# Patient Record
Sex: Female | Born: 1980 | Race: Black or African American | Hispanic: No | Marital: Married | Smoking: Never smoker
Health system: Southern US, Community
[De-identification: ages and names within clinical notes are randomized; demographics above are authoritative.]

## PROBLEM LIST (undated history)

## (undated) DIAGNOSIS — L732 Hidradenitis suppurativa: Secondary | ICD-10-CM

---

## 2016-04-19 ENCOUNTER — Emergency Department (HOSPITAL_COMMUNITY)
Admission: EM | Admit: 2016-04-19 | Discharge: 2016-04-20 | Disposition: A | Discharge status: Home / Self Care | Payer: Medicaid - Out of State | Attending: Emergency Medicine | Admitting: Emergency Medicine

## 2016-04-19 ENCOUNTER — Encounter (HOSPITAL_COMMUNITY): Payer: Self-pay | Admitting: Emergency Medicine

## 2016-04-19 DIAGNOSIS — H6001 Abscess of right external ear: Secondary | ICD-10-CM

## 2016-04-19 DIAGNOSIS — H9201 Otalgia, right ear: Secondary | ICD-10-CM | POA: Diagnosis present

## 2016-04-19 HISTORY — DX: Hidradenitis suppurativa: L73.2

## 2016-04-19 NOTE — ED Triage Notes (Signed)
Called x 1 w/o answer.  

## 2016-04-19 NOTE — ED Triage Notes (Signed)
Pt states that she started having R ear lobe swelling two days ago that popped and drained today. The ear is still very tender and the neck below it. Alert and oriented.

## 2016-04-20 MED ORDER — SULFAMETHOXAZOLE-TRIMETHOPRIM 800-160 MG PO TABS: 1.0000 | ORAL_TABLET | Freq: Two times a day (BID) | ORAL | 0 refills | Status: AC

## 2016-04-20 MED ORDER — SULFAMETHOXAZOLE-TRIMETHOPRIM 800-160 MG PO TABS
1.0000 | ORAL_TABLET | Freq: Once | ORAL | Status: AC
  Administered 2016-04-20: 1 via ORAL

## 2016-04-20 MED ORDER — SULFAMETHOXAZOLE-TRIMETHOPRIM 800-160 MG PO TABS
ORAL_TABLET | ORAL | Status: AC
  Filled 2016-04-20: qty 1

## 2016-04-20 NOTE — Discharge Instructions (Signed)
Take antibiotics as prescribed. Continue warm compresses several times a day. Return to the ER for new or worsening symptoms.

## 2016-04-20 NOTE — ED Provider Notes (Signed)
[facility]-EMERGENCY DEPT Provider Note   CSN: [id number] Arrival date & time: 04/19/16  2225  By signing my name below, I, Alyssa GroveMartin Green, attest that this documentation has been prepared under the direction and in the presence of LarnedSerena Zoria Rawlinson, [place]. Electronically Signed: Alyssa GroveMartin Green, ED Scribe. 04/20/16. 12:15 AM.  History   Chief Complaint Chief Complaint  Patient presents with  . Otalgia   The history is provided by the patient. No language interpreter was used.    HPI Comments: Sara Durham is a 35 y.o. female who presents to the Emergency Department complaining of gradual onset, constant right ear lobe pain onset two days ago. She reports her earlobe swelling up and getting tender. She states today her ear lobe lesion popped and drained. She states her neck below her ear is sensitive to the touch and feels a bit swollen. Her ear swelling has decreased, but is still tender. Pt has her ear pierced in the area. She denies having similar symptoms before. Denies fever, chills.   Past Medical History:  Diagnosis Date  . Hydradenitis     There are no active problems to display for this patient.  History reviewed. No pertinent surgical history.  OB History    No data available      Home Medications    Prior to Admission medications   Not on File   Family History No family history on file.  Social History Social History  Substance Use Topics  . Smoking status: Never Smoker  . Smokeless tobacco: Never Used  . Alcohol use No     Allergies   Review of patient's allergies indicates no known allergies.   Review of Systems Review of Systems  Constitutional: Negative for fever.  HENT: Positive for ear discharge and ear pain.   Musculoskeletal: Positive for neck pain.   Physical Exam Updated Vital Signs BP 117/74 (BP Location: Right Arm)   Pulse 61   Temp 98.1 F (36.7 C) (Oral)   Resp 18   LMP 03/28/2016   SpO2 100%   Physical Exam  Constitutional: She appears  well-developed and well-nourished. She is active. No distress.  HENT:  Head: Normocephalic and atraumatic.  Right earlobe with area that has scabbed over. No fluctuance or edema. Trace amount of purulence expressible. No skin erythema.  TM unremarkable bilaterally  Eyes: Conjunctivae are normal.  Neck: Normal range of motion.  +right submandibular lymphadenopathy  Cardiovascular: Normal rate.   Pulmonary/Chest: Effort normal. No respiratory distress.  Neurological: She is alert.  Skin: Skin is warm and dry.  Psychiatric: She has a normal mood and affect. Her behavior is normal.  Nursing note and vitals reviewed.  ED Treatments / Results  DIAGNOSTIC STUDIES: Oxygen Saturation is 100% on RA, normal by my interpretation.    COORDINATION OF CARE: 12:19 AM Discussed treatment plan with pt at bedside which includes Bactrim and pt agreed to plan.  Labs (all labs ordered are listed, but only abnormal results are displayed) Labs Reviewed - No data to display  EKG  EKG Interpretation None       Radiology No results found.  Procedures Procedures (including critical care time)  Medications Ordered in ED Medications - No data to display   Initial Impression / Assessment and Plan / ED Course  I have reviewed the triage vital signs and the nursing notes.  Pertinent labs & imaging results that were available during my care of the patient were reviewed by me and considered in my medical decision making (  see chart for details).  Clinical Course    Pt with what appears to be a right earlobe abscess that has drained on its own. Will cover with course of bactrim to ensure resolution of infection. Encouraged warm compresses. ER return precautions given.  Final Clinical Impressions(s) / ED Diagnoses   Final diagnoses:  Abscess of earlobe, right    New Prescriptions Discharge Medication List as of 04/20/2016 12:22 AM    START taking these medications   Details    sulfamethoxazole-trimethoprim (BACTRIM DS,SEPTRA DS) 800-160 MG tablet Take 1 tablet by mouth 2 (two) times daily., Starting Fri 04/20/2016, Until Fri 04/27/2016, Print       I personally performed the services described in this documentation, which was scribed in my presence. The recorded information has been reviewed and is accurate.    Carlene Coria, PA-C 04/20/16 1926    Arby Barrette, MD 04/24/16 [phone]

## 2016-07-11 ENCOUNTER — Encounter (HOSPITAL_COMMUNITY): Payer: Self-pay | Admitting: Emergency Medicine

## 2016-07-11 ENCOUNTER — Emergency Department (HOSPITAL_COMMUNITY): Payer: Medicaid - Out of State

## 2016-07-11 ENCOUNTER — Emergency Department (HOSPITAL_COMMUNITY)
Admission: EM | Admit: 2016-07-11 | Discharge: 2016-07-11 | Disposition: A | Discharge status: Home / Self Care | Payer: Medicaid - Out of State | Attending: Emergency Medicine | Admitting: Emergency Medicine

## 2016-07-11 DIAGNOSIS — N6320 Unspecified lump in the left breast, unspecified quadrant: Secondary | ICD-10-CM | POA: Diagnosis present

## 2016-07-11 DIAGNOSIS — L0291 Cutaneous abscess, unspecified: Secondary | ICD-10-CM

## 2016-07-11 NOTE — ED Triage Notes (Signed)
Per pt, states she noticed a palpable mass in left breast-no discharge-pain at site

## 2016-07-11 NOTE — ED Notes (Signed)
Ultrasound at bedside

## 2016-07-11 NOTE — Discharge Instructions (Signed)
You were seen in the emergency room today for evaluation of a mass in your left breast. Ultrasound revealed two cystic masses, the larger one is the one that you feel now. The radiologist did recommend aspiration of the larger cyst if we are concerned about possible infection. I am concerned you might be developing an infection and would like you to see Dr[facility] tomorrow for further evaluation and possible drainage. You have an appointment at their office at 3:00 PM. Please arrive at 2:30 PM. Their office address and phone number is listed on this paperwork. Return to the emergency room for new or worsening symptoms.

## 2016-07-11 NOTE — ED Provider Notes (Signed)
[facility]-EMERGENCY DEPT Provider Note   CSN: [id number] Arrival date & time: 07/11/16  1315  By signing my name below, I, Sonum Patel, attest that this documentation has been prepared under the direction and in the presence of Traycen Goyer, PA-C. Electronically Signed: Sonum Patel, Neurosurgeon. 07/11/16. 3:25 PM.   History   Chief Complaint Chief Complaint  Patient presents with  . lump in breast    The history is provided by the patient. No language interpreter was used.     HPI Comments: Sara Durham is a 35 y.o. female with past medical history of hidradenitis suppurativa who presents to the Emergency Department complaining of a painful lump to the left breast that she noticed yesterday. She states she is sure it wasn't there before.  She reports history of abscesses but typically to the groin and buttocks than the axilla or breast. She denies fever, chills, nipple drainage. Denies nausea or vomiting. She is not currently breastfeeding. Denies overlying skin color change. Denies history of breast mass. Denies personal history or family history of breast cancer.   Notably pt recently moved to [place] from [place] and has yet to establish primary care locally.  Past Medical History:  Diagnosis Date  . Hydradenitis     There are no active problems to display for this patient.   History reviewed. No pertinent surgical history.  OB History    No data available       Home Medications    Prior to Admission medications   Not on File    Family History No family history on file.  Social History Social History  Substance Use Topics  . Smoking status: Never Smoker  . Smokeless tobacco: Never Used  . Alcohol use No     Allergies   Patient has no known allergies.   Review of Systems Review of Systems  10 Systems reviewed and are negative for acute change except as noted in the HPI.   Physical Exam Updated Vital Signs BP 114/72   Pulse (!) 59   Temp 98.6 F (37 C)    Resp 16   LMP 07/11/2016   SpO2 98%   Physical Exam  Constitutional: She is oriented to person, place, and time. She appears well-developed and well-nourished.  HENT:  Head: Normocephalic and atraumatic.  Right Ear: External ear normal.  Left Ear: External ear normal.  Eyes: Conjunctivae are normal.  Neck: Normal range of motion. Neck supple.  Cardiovascular: Normal rate, regular rhythm and normal heart sounds.   Pulmonary/Chest: Effort normal and breath sounds normal. No respiratory distress.  Abdominal: Soft. Bowel sounds are normal. She exhibits no distension. There is no tenderness. There is no guarding.  Lymphadenopathy:    She has no cervical adenopathy.    She has no axillary adenopathy.       Right: No supraclavicular adenopathy present.       Left: No supraclavicular adenopathy present.  Neurological: She is alert and oriented to person, place, and time.  Skin: Skin is warm and dry.  Left breast with golf ball sized tender fluctuant mass palpable just posterior to nipple and areola. There is no overlying skin change. No erythema or swelling of the breast. No nipple drainage or lesion.   Psychiatric: She has a normal mood and affect.  Nursing note and vitals reviewed.    ED Treatments / Results  DIAGNOSTIC STUDIES: Oxygen Saturation is 98% on RA, normal by my interpretation.    COORDINATION OF CARE: 3:26 PM Discussed treatment  plan with pt at bedside and pt agreed to plan.   Labs (all labs ordered are listed, but only abnormal results are displayed) Labs Reviewed - No data to display  EKG  EKG Interpretation None       Radiology [place] Breast Complete Uni Left Inc Axilla  Result Date: 07/11/2016 CLINICAL DATA:  Evaluate for breast abscess. Lump on left breast for 1 day. EXAM: ULTRASOUND OF THE LEFT BREAST COMPARISON:  Previous exam(s). FINDINGS: Targeted ultrasound is performed, showing a smooth oval circumscribed nearly anechoic cyst with a thin wall and  increased through transmission of the sound beam. A contains some internal echoes. It lies in the 3 o'clock position and measures 2.6 x 2.0 x 2.2 cm. There is an adjacent smaller cyst with no internal echoes, measuring 1.8 x 1.2 x 1.7 cm, lying in the 2 o'clock position. The larger of these 2 abnormalities corresponds to the palpable lesion. IMPRESSION: There are 2 adjacent breast cysts. The larger measuring 2.6 cm in greatest dimension. The larger cyst contains a few internal echoes but no other complicating features. If symptoms are suspicious for an abscess, cyst aspiration should be considered. There are no findings on ultrasound suspicious for malignancy. RECOMMENDATION: No specific imaging follow-up recommended for the left breast cysts. Aspiration of the larger cyst is recommended if there are symptoms suspicious for infection. Screening mammogram at age 35 unless there are persistent or intervening clinical concerns. (Code:SM-B-40A) I have discussed the findings and recommendations with the patient. Results were also provided in writing at the conclusion of the visit. If applicable, a reminder letter will be sent to the patient regarding the next appointment. BI-RADS CATEGORY  2: Benign. Electronically Signed   By: Amie Portlandavid  Ormond M.D.   On: 07/11/2016 16:58    Procedures Procedures (including critical care time)  Medications Ordered in ED Medications - No data to display   Initial Impression / Assessment and Plan / ED Course  I have reviewed the triage vital signs and the nursing notes.  Pertinent labs & imaging results that were available during my care of the patient were reviewed by me and considered in my medical decision making (see chart for details).  Clinical Course    Pt is an 35 y.o. female with history of hidradenitis suppurativa, typically with pelvic/buttock abscesses, presenting with acute onset left breast mass since yesterday. There is a golf-ball sized fluctuant lesion  palpable in her left breast. No overlying skin change. No nipple discharge. The mass is fluctuant and tender. She is otherwise afebrile and overall nontoxic appearing. No tachycardia, tachypnea. Normotensive. I suspect she has a breast abscess developing that will require further imaging and treatment as an outpatient. Due to pt's lack of local PCP or insurance I will help her call [facility] to refer her there to establish surgical care and to be seen for evaluation and possible drainage of her breast lesion.  I spoke with Nettie ElmSylvia, nurse at [facility]. Dr. Corliss Skainssuei can see pt tomorrow 2:30 PM. Prior to this appointment he would like us to obtain breast US in the ED today. I have ordered this.  I have informed pt and her husband of appointment time tomorrow afternoon and up-front financial charge. They are in agreement with this plan.  Breast ultrasound as above; 2 adjacent breast cysts were visualized with no other suspicious lesions. Recommend drainage of the larger cyst if there is concern for possible infection/abscess. Pt will keep her appointment with Dr. Corliss Skainssuei tomorrow afternoon as planned for  further evaluation and possible drainage. She declines any antibiotics at this time. She states antibiotics make her hidradenitis "flare up." Pt otherwise afebrile, hemodynamically stable. ER return precautions given.  Final Clinical Impressions(s) / ED Diagnoses   Final diagnoses:  Abscess  Left breast mass    New Prescriptions New Prescriptions   No medications on file   I personally performed the services described in this documentation, which was scribed in my presence. The recorded information has been reviewed and is accurate.    Carlene CoriaSerena Y Cahterine Heinzel, PA-C 07/11/16 1728    Jacalyn LefevreJulie Haviland, MD 07/11/16 2025

## 2017-09-02 IMAGING — US US BREAST*L* COMPLETE INC AXILLA
1 series · 13 of 25 positions shown · non-contrast
Comparison: Previous exam(s).

CLINICAL DATA: Evaluate for breast abscess. Lump on left breast for
1 day.

EXAM:
ULTRASOUND OF THE LEFT BREAST

[Series 1: us breast*left* complete inc axilla · 0.07mm/px · 28 acquisitions, 13 frames shown]
[im 1/28]
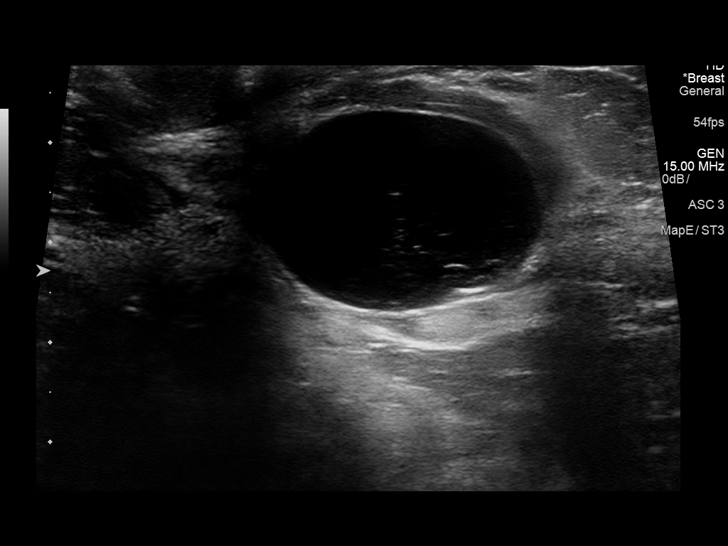
[im 3/28]
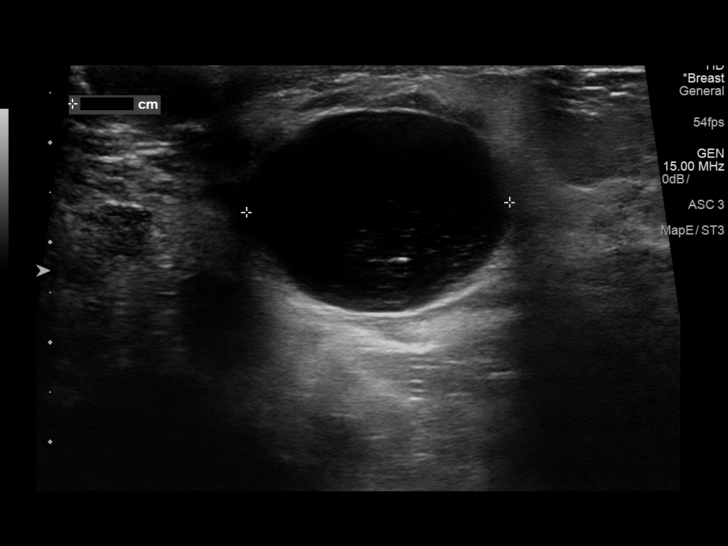
[im 5/28]
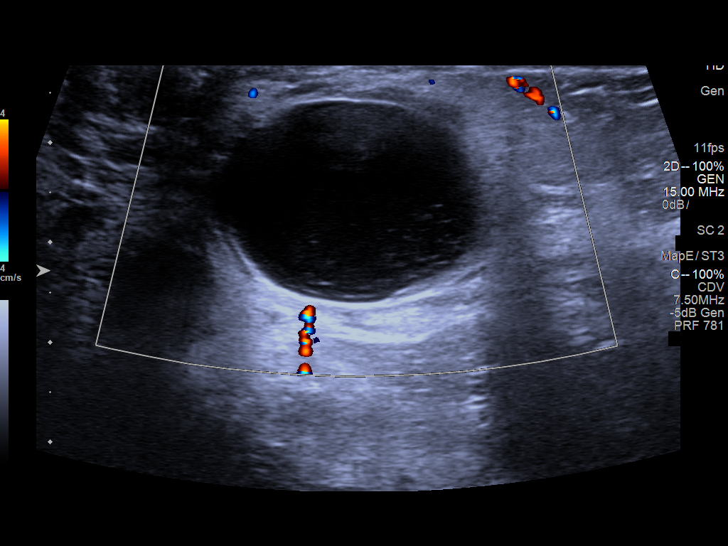
[im 7/28]
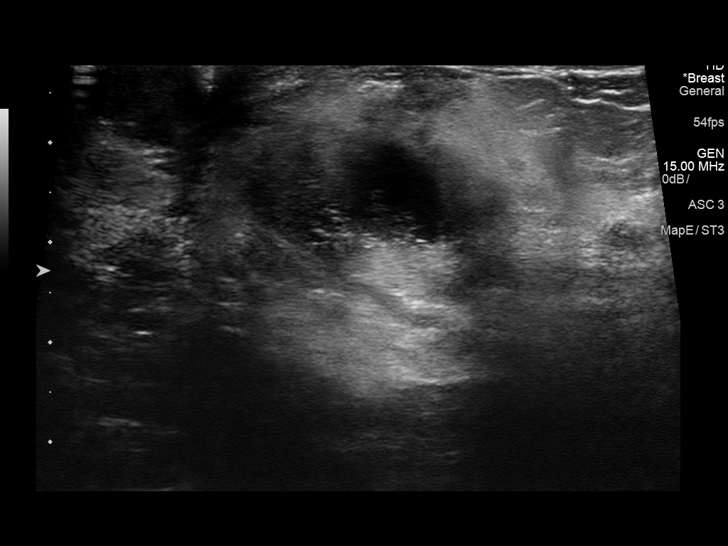
[im 10/28]
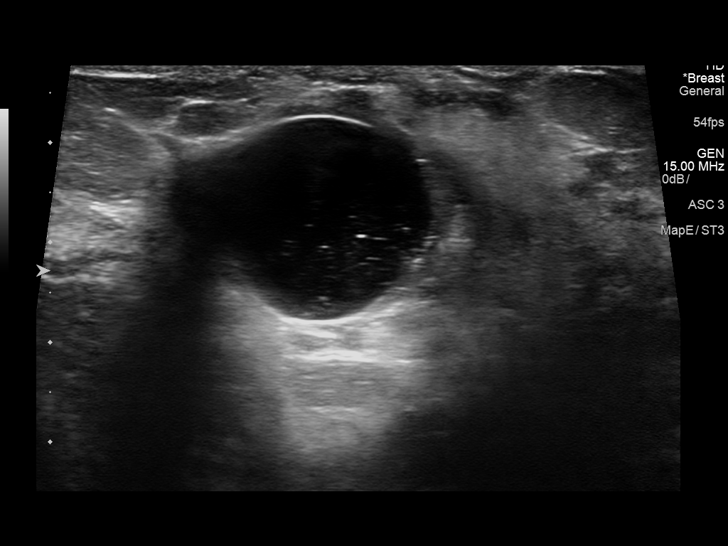
[im 12/28]
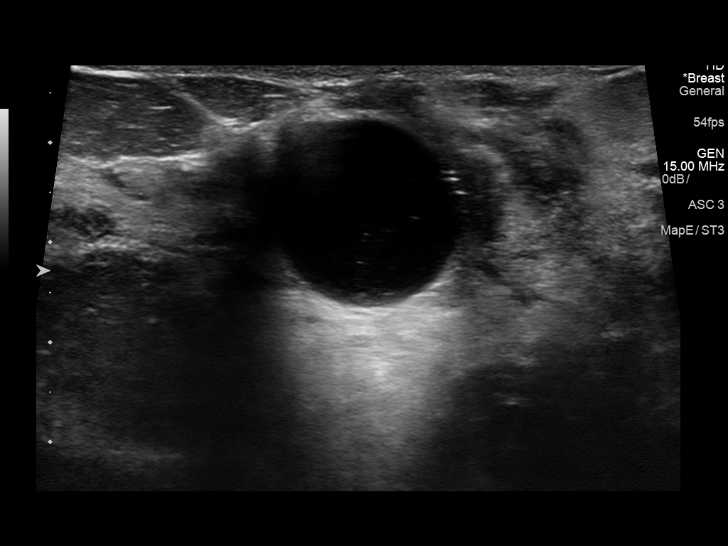
[im 14/28]
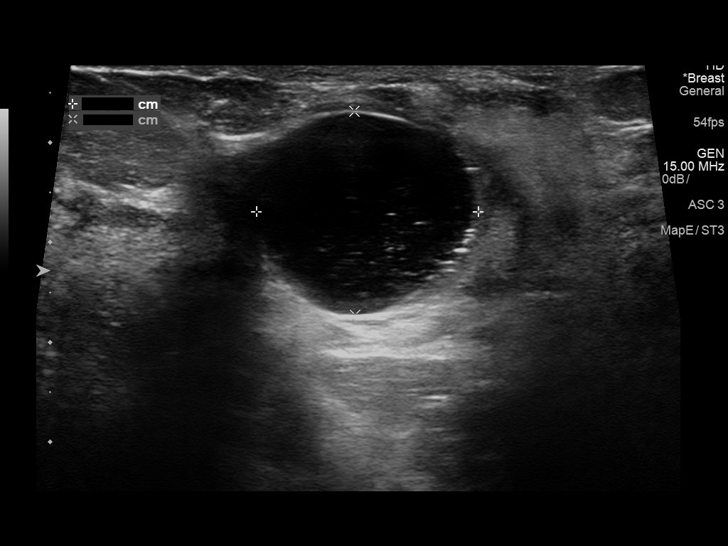
[im 16/28]
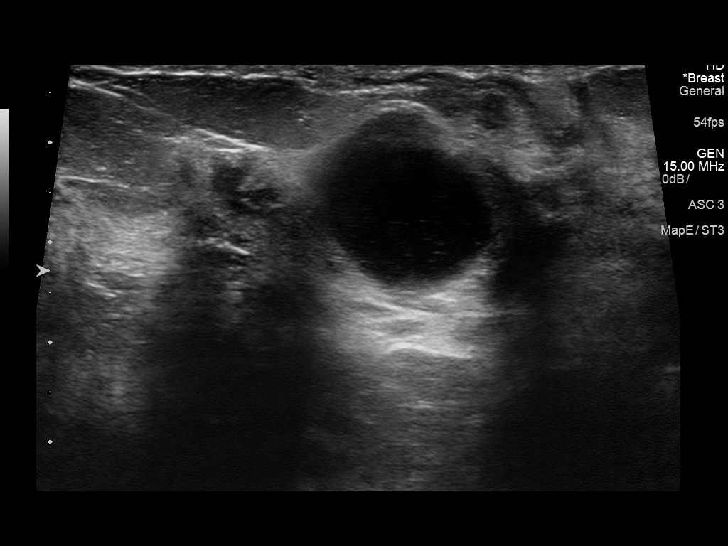
[im 19/28]
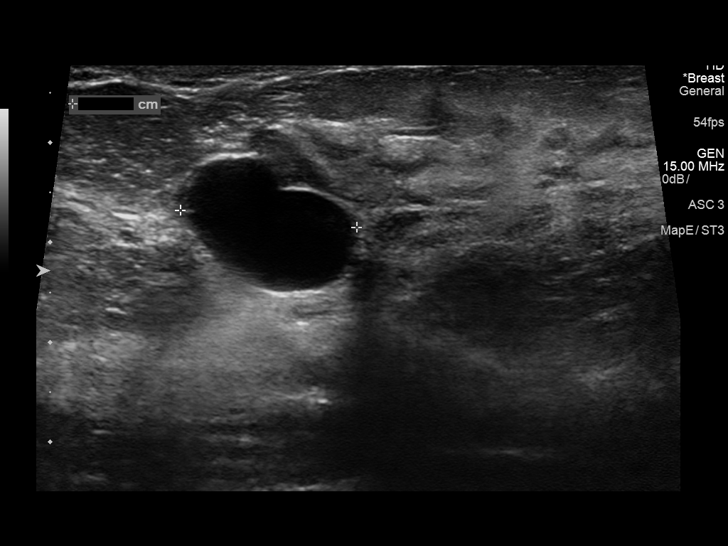
[im 21/28]
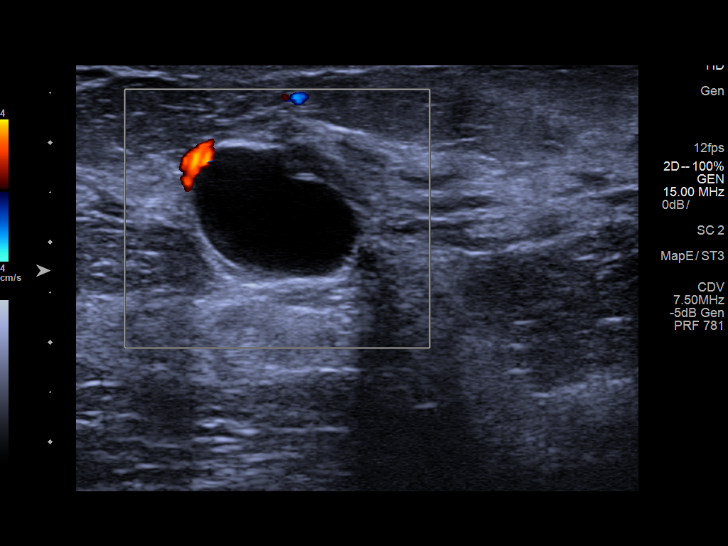
[im 23/28]
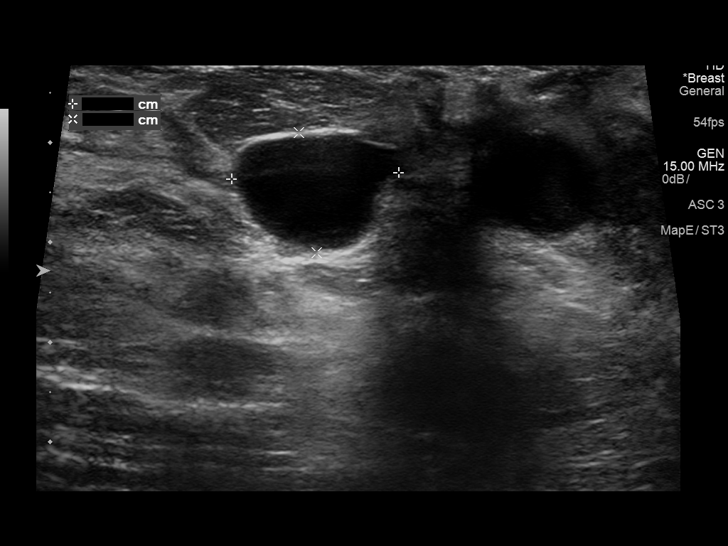
[im 25/28]
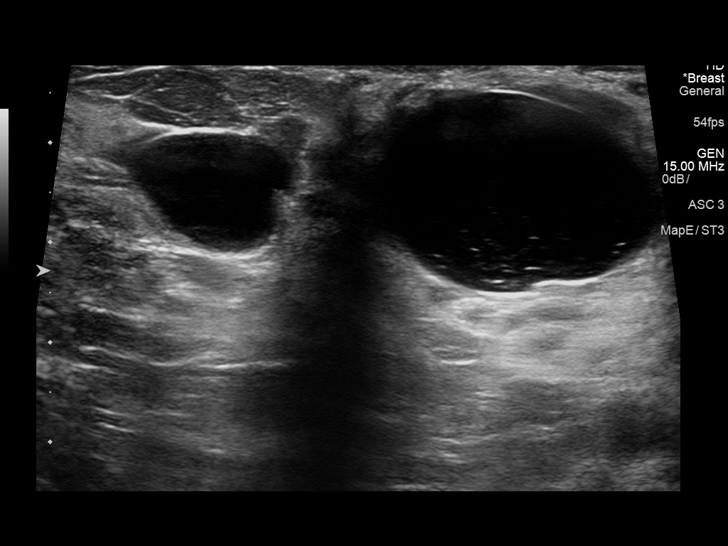
[im 28/28]
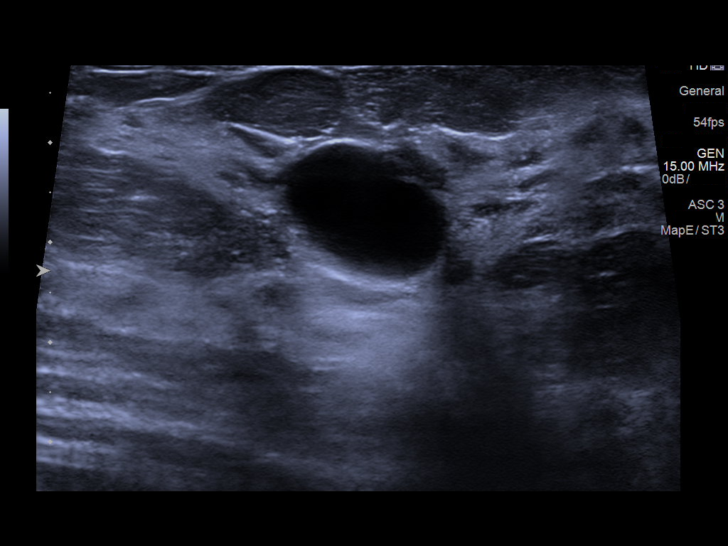

[13 of 25 positions shown; findings below may reference images not displayed]

FINDINGS: Targeted ultrasound is performed, showing a smooth oval
circumscribed nearly anechoic cyst with a thin wall and increased
through transmission of the sound beam. A contains some internal
echoes. It lies in the 3 o'clock position and measures 2.6 x 2.0 x
2.2 cm. There is an adjacent smaller cyst with no internal echoes,
measuring 1.8 x 1.2 x 1.7 cm, lying in the 2 o'clock position. The
larger of these 2 abnormalities corresponds to the palpable lesion.
IMPRESSION: There are 2 adjacent breast cysts. The larger measuring 2.6 cm in
greatest dimension. The larger cyst contains a few internal echoes
but no other complicating features. If symptoms are suspicious for
an abscess, cyst aspiration should be considered.

There are no findings on ultrasound suspicious for malignancy.

RECOMMENDATION:
No specific imaging follow-up recommended for the left breast cysts.
Aspiration of the larger cyst is recommended if there are symptoms
suspicious for infection.

Screening mammogram at age 41 unless there are persistent or
intervening clinical concerns. (Code:GA-B-GA9)

I have discussed the findings and recommendations with the patient.
Results were also provided in writing at the conclusion of the
visit. If applicable, a reminder letter will be sent to the patient
regarding the next appointment.

BI-RADS CATEGORY  2: Benign.

## 2017-09-15 ENCOUNTER — Other Ambulatory Visit: Payer: Self-pay

## 2017-09-15 ENCOUNTER — Encounter (HOSPITAL_COMMUNITY): Payer: Self-pay

## 2017-09-15 DIAGNOSIS — O26811 Pregnancy related exhaustion and fatigue, first trimester: Secondary | ICD-10-CM | POA: Insufficient documentation

## 2017-09-15 DIAGNOSIS — Z3A00 Weeks of gestation of pregnancy not specified: Secondary | ICD-10-CM | POA: Diagnosis not present

## 2017-09-15 DIAGNOSIS — O9989 Other specified diseases and conditions complicating pregnancy, childbirth and the puerperium: Secondary | ICD-10-CM | POA: Diagnosis present

## 2017-09-15 LAB — CBC
HCT: 33.2 % — ABNORMAL LOW (ref 36.0–46.0)
HEMOGLOBIN: 11.1 g/dL — AB (ref 12.0–15.0)
MCH: 27.5 pg (ref 26.0–34.0)
MCHC: 33.4 g/dL (ref 30.0–36.0)
MCV: 82.2 fL (ref 78.0–100.0)
Platelets: 323 10*3/uL (ref 150–400)
RBC: 4.04 MIL/uL (ref 3.87–5.11)
RDW: 14.3 % (ref 11.5–15.5)
WBC: 7.3 10*3/uL (ref 4.0–10.5)

## 2017-09-15 LAB — I-STAT BETA HCG BLOOD, ED (MC, WL, AP ONLY)

## 2017-09-15 LAB — COMPREHENSIVE METABOLIC PANEL
ALT: 23 U/L (ref 14–54)
ANION GAP: 7 (ref 5–15)
AST: 23 U/L (ref 15–41)
Albumin: 3.5 g/dL (ref 3.5–5.0)
Alkaline Phosphatase: 46 U/L (ref 38–126)
BUN: 15 mg/dL (ref 6–20)
CALCIUM: 9.2 mg/dL (ref 8.9–10.3)
CHLORIDE: 105 mmol/L (ref 101–111)
CO2: 24 mmol/L (ref 22–32)
Creatinine, Ser: 0.67 mg/dL (ref 0.44–1.00)
Glucose, Bld: 115 mg/dL — ABNORMAL HIGH (ref 65–99)
Potassium: 3.8 mmol/L (ref 3.5–5.1)
Sodium: 136 mmol/L (ref 135–145)
Total Bilirubin: 0.2 mg/dL — ABNORMAL LOW (ref 0.3–1.2)
Total Protein: 7.4 g/dL (ref 6.5–8.1)

## 2017-09-15 LAB — LIPASE, BLOOD: LIPASE: 28 U/L (ref 11–51)

## 2017-09-15 NOTE — ED Triage Notes (Signed)
Pt c/o nausea, fatigue, productive cough, and constipation x2 weeks. She denies pain. Denies fever or SOB. A&Ox4. Ambulatory.

## 2017-09-16 ENCOUNTER — Emergency Department (HOSPITAL_COMMUNITY)
Admission: EM | Admit: 2017-09-16 | Discharge: 2017-09-16 | Disposition: A | Discharge status: Home / Self Care | Payer: Medicaid Other | Attending: Emergency Medicine | Admitting: Emergency Medicine

## 2017-09-16 DIAGNOSIS — O26811 Pregnancy related exhaustion and fatigue, first trimester: Secondary | ICD-10-CM

## 2017-09-16 LAB — URINALYSIS, ROUTINE W REFLEX MICROSCOPIC
Bilirubin Urine: NEGATIVE
GLUCOSE, UA: NEGATIVE mg/dL
Hgb urine dipstick: NEGATIVE
KETONES UR: 5 mg/dL — AB
LEUKOCYTES UA: NEGATIVE
Nitrite: NEGATIVE
PH: 5 (ref 5.0–8.0)
Protein, ur: NEGATIVE mg/dL
Specific Gravity, Urine: 1.035 — ABNORMAL HIGH (ref 1.005–1.030)

## 2017-09-16 LAB — HCG, QUANTITATIVE, PREGNANCY: hCG, Beta Chain, Quant, S: 174589 m[IU]/mL — ABNORMAL HIGH (ref <5)

## 2017-09-16 MED ORDER — RANITIDINE HCL 150 MG PO CAPS: 150.0000 mg | ORAL_CAPSULE | Freq: Every day | ORAL | 0 refills | Status: AC | PRN

## 2017-09-16 MED ORDER — SODIUM CHLORIDE 0.9 % IV BOLUS (SEPSIS)
1000.0000 mL | Freq: Once | INTRAVENOUS | Status: AC
Start: 2017-09-16 — End: 2017-09-16
  Administered 2017-09-16: 1000 mL via INTRAVENOUS

## 2017-09-16 NOTE — ED Provider Notes (Signed)
[facility] DEPT Provider Note   CSN: [id number] Arrival date & time: 09/15/17  2052    History   Chief Complaint Chief Complaint  Patient presents with  . Nausea  . Fatigue  . Cough  . Constipation    HPI Sara Durham is a 37 y.o. female.  37 year old G9P1021 female with hx of hydradenitis presents to the ED for complaint of increasing fatigue.  She has noticed fatigue progressively worsening over the past month.  She has also had increased constipation over the past 2 weeks with worsening reflux.  She has been drinking less water because the taste in her mouth has changed.  She does report a bowel movement earlier this morning.  She has not taken any medication for her symptoms.  She denies associated fever, cough, shortness of breath, abdominal pain, vomiting, vaginal bleeding, vaginal discharge, dysuria, hematuria.  No history of abdominal surgeries.  LMP 07/23/17      Past Medical History:  Diagnosis Date  . Hydradenitis     There are no active problems to display for this patient.   History reviewed. No pertinent surgical history.  OB History    No data available       Home Medications    Prior to Admission medications   Medication Sig Start Date End Date Taking? Authorizing Provider  ranitidine (ZANTAC) 150 MG capsule Take 1 capsule (150 mg total) by mouth daily as needed for heartburn. 09/16/17   Antonietta Breach, PA-C    Family History History reviewed. No pertinent family history.  Social History Social History   Tobacco Use  . Smoking status: Never Smoker  . Smokeless tobacco: Never Used  Substance Use Topics  . Alcohol use: No  . Drug use: No     Allergies   Patient has no known allergies.   Review of Systems Review of Systems Ten systems reviewed and are negative for acute change, except as noted in the HPI.    Physical Exam Updated Vital Signs BP 112/70   Pulse 64   Temp 97.9 F (36.6 C) (Oral)    Resp 16   SpO2 100%   Physical Exam  Constitutional: She is oriented to person, place, and time. She appears well-developed and well-nourished. No distress.  Nontoxic appearing and in NAD  HENT:  Head: Normocephalic and atraumatic.  Eyes: Conjunctivae and EOM are normal. No scleral icterus.  Neck: Normal range of motion.  Cardiovascular: Normal rate, regular rhythm and intact distal pulses.  Pulmonary/Chest: Effort normal. No stridor. No respiratory distress. She has no wheezes.  Respirations even and unlabored  Abdominal: Soft. She exhibits no distension. There is no tenderness. There is no guarding.  Soft, obese, nontender  Musculoskeletal: Normal range of motion.  Neurological: She is alert and oriented to person, place, and time. She exhibits normal muscle tone. Coordination normal.  Skin: Skin is warm and dry. No rash noted. She is not diaphoretic. No erythema. No pallor.  Psychiatric: She has a normal mood and affect. Her behavior is normal.  Nursing note and vitals reviewed.    ED Treatments / Results  Labs (all labs ordered are listed, but only abnormal results are displayed) Labs Reviewed  COMPREHENSIVE METABOLIC PANEL - Abnormal; Notable for the following components:      Result Value   Glucose, Bld 115 (*)    Total Bilirubin 0.2 (*)    All other components within normal limits  CBC - Abnormal; Notable for the following components:  Hemoglobin 11.1 (*)    HCT 33.2 (*)    All other components within normal limits  URINALYSIS, ROUTINE W REFLEX MICROSCOPIC - Abnormal; Notable for the following components:   Specific Gravity, Urine 1.035 (*)    Ketones, ur 5 (*)    All other components within normal limits  HCG, QUANTITATIVE, PREGNANCY - Abnormal; Notable for the following components:   hCG, Beta Chain, Quant, S 174,589 (*)    All other components within normal limits  I-STAT BETA HCG BLOOD, ED ([facility], [facility], [facility] ONLY) - Abnormal; Notable for the following components:    I-stat hCG, quantitative >2,000.0 (*)    All other components within normal limits  I-STAT BETA HCG BLOOD, ED ([facility], [facility], [facility] ONLY) - Abnormal; Notable for the following components:   I-stat hCG, quantitative >2,000.0 (*)    All other components within normal limits  LIPASE, BLOOD    EKG  EKG Interpretation None       Radiology No results found.  Procedures Procedures (including critical care time)  Medications Ordered in ED Medications  sodium chloride 0.9 % bolus 1,000 mL (0 mLs Intravenous Stopped 09/16/17 0408)     Initial Impression / Assessment and Plan / ED Course  I have reviewed the triage vital signs and the nursing notes.  Pertinent labs & imaging results that were available during my care of the patient were reviewed by me and considered in my medical decision making (see chart for details).     37 year old female presents to the emergency department for evaluation of increasing fatigue.  This has been associated with reflux as well as constipation.  She has no complaints of abdominal pain.  No fevers, vaginal bleeding, vaginal discharge, urinary symptoms.  She was found to be pregnant today.  Last menstrual period 07/23/2017.  Myriad of symptoms consistent with side effects of early pregnancy.  The patient has been hydrated in the emergency department with 1 L IV fluids.  A bedside ultrasound was attempted by myself as well as my attending.  Unable to clearly confirm IUP.  Formal imaging was offered to the patient while she was in the emergency department which she declined.  In the absence of vaginal bleeding, vaginal discharge, abdominal pain, I do not believe emergent ultrasound is indicated to confirm pregnancy placement. I have encouraged the patient to follow-up with an OB/GYN in an outpatient basis for additional prenatal care.  Patient prescribed Zantac for reflux management.  Return precautions discussed and provided. Patient discharged in stable condition with no  unaddressed concerns.  Vitals:   09/16/17 0055 09/16/17 0100 09/16/17 0300 09/16/17 0400  BP: 118/74  114/80 112/70  Pulse: 77  62 64  Resp: '18 18 16 16  '$ Temp:      TempSrc:      SpO2: 100%  98% 100%    Final Clinical Impressions(s) / ED Diagnoses   Final diagnoses:  Fatigue during pregnancy in first trimester    ED Discharge Orders        Ordered    ranitidine (ZANTAC) 150 MG capsule  Daily PRN     09/16/17 0415       Antonietta Breach, PA-C 09/16/17 0437    Ezequiel Essex, MD 09/16/17 [phone]

## 2017-09-16 NOTE — Discharge Instructions (Signed)
Start taking a prenatal vitamin.  You have been prescribed Zantac for reflux symptoms to take as needed.  Be sure to remain well-hydrated and try to drink plenty of water daily.  We recommend follow-up with an OB/GYN for prenatal care.  Return to the emergency department if you develop vaginal bleeding, abdominal pain, fevers, or other new or concerning symptoms.
